# Patient Record
Sex: Male | Born: 1985 | Race: Black or African American | Hispanic: No | Marital: Single | Smoking: Never smoker
Health system: Southern US, Community
[De-identification: ages and names within clinical notes are randomized; demographics above are authoritative.]

---

## 2013-01-17 ENCOUNTER — Emergency Department (HOSPITAL_COMMUNITY)
Admission: EM | Admit: 2013-01-17 | Discharge: 2013-01-17 | Disposition: A | Discharge status: Home / Self Care | Payer: Self-pay | Attending: Emergency Medicine | Admitting: Emergency Medicine

## 2013-01-17 ENCOUNTER — Emergency Department (HOSPITAL_COMMUNITY): Payer: Self-pay

## 2013-01-17 ENCOUNTER — Emergency Department (INDEPENDENT_AMBULATORY_CARE_PROVIDER_SITE_OTHER)
Admission: EM | Admit: 2013-01-17 | Discharge: 2013-01-17 | Disposition: A | Discharge status: Home / Self Care | Payer: Self-pay | Source: Home / Self Care | Attending: Family Medicine | Admitting: Family Medicine

## 2013-01-17 ENCOUNTER — Encounter (HOSPITAL_COMMUNITY): Payer: Self-pay | Admitting: Emergency Medicine

## 2013-01-17 DIAGNOSIS — R1011 Right upper quadrant pain: Secondary | ICD-10-CM | POA: Insufficient documentation

## 2013-01-17 DIAGNOSIS — R109 Unspecified abdominal pain: Secondary | ICD-10-CM

## 2013-01-17 DIAGNOSIS — R1031 Right lower quadrant pain: Secondary | ICD-10-CM | POA: Insufficient documentation

## 2013-01-17 LAB — CBC WITH DIFFERENTIAL/PLATELET
Basophils Absolute: 0.1 10*3/uL (ref 0.0–0.1)
Basophils Relative: 1 % (ref 0–1)
Eosinophils Absolute: 0.4 10*3/uL (ref 0.0–0.7)
HCT: 46.9 % (ref 39.0–52.0)
Hemoglobin: 17 g/dL (ref 13.0–17.0)
MCH: 31.7 pg (ref 26.0–34.0)
MCHC: 36.2 g/dL — ABNORMAL HIGH (ref 30.0–36.0)
Neutro Abs: 3.4 10*3/uL (ref 1.7–7.7)
Neutrophils Relative %: 58 % (ref 43–77)
Platelets: 201 10*3/uL (ref 150–400)
WBC: 5.9 10*3/uL (ref 4.0–10.5)

## 2013-01-17 LAB — COMPREHENSIVE METABOLIC PANEL
ALT: 42 U/L (ref 0–53)
AST: 28 U/L (ref 0–37)
Albumin: 4.1 g/dL (ref 3.5–5.2)
Alkaline Phosphatase: 106 U/L (ref 39–117)
BUN: 12 mg/dL (ref 6–23)
GFR calc Af Amer: >90 mL/min (ref >90)
Potassium: 3.8 mEq/L (ref 3.5–5.1)
Sodium: 139 mEq/L (ref 135–145)
Total Protein: 8.1 g/dL (ref 6.0–8.3)

## 2013-01-17 LAB — LIPASE, BLOOD: Lipase: 17 U/L (ref 11–59)

## 2013-01-17 MED ORDER — IOHEXOL 300 MG/ML  SOLN
100.0000 mL | Freq: Once | INTRAMUSCULAR | Status: AC | PRN
  Administered 2013-01-17: 100 mL via INTRAVENOUS

## 2013-01-17 MED ORDER — IOHEXOL 300 MG/ML  SOLN
25.0000 mL | Freq: Once | INTRAMUSCULAR | Status: AC | PRN
  Administered 2013-01-17: 25 mL via ORAL

## 2013-01-17 MED ORDER — TRAMADOL HCL 50 MG PO TABS: 50.0000 mg | ORAL_TABLET | Freq: Four times a day (QID) | ORAL | Status: DC | PRN

## 2013-01-17 NOTE — ED Provider Notes (Signed)
Medical screening examination/treatment/procedure(s) were performed by resident physician or non-physician practitioner and as supervising physician I was immediately available for consultation/collaboration.   Sarah Baez DOUGLAS MD.   Sharion Grieves D Rayvin Abid, MD 01/17/13 2000 

## 2013-01-17 NOTE — ED Provider Notes (Signed)
CSN: [id number]     Arrival date & time 01/17/13  1626 History   First MD Initiated Contact with Patient 01/17/13 1745     Chief Complaint  Patient presents with  . Abdominal Pain   (Consider location/radiation/quality/duration/timing/severity/associated sxs/prior Treatment) Patient is a 27 y.o. male presenting with abdominal pain. The history is provided by the patient. No language interpreter was used.  Abdominal Pain This is a new problem. The current episode started yesterday. The problem occurs constantly. The problem has been gradually worsening. Associated symptoms include abdominal pain. Nothing aggravates the symptoms. Nothing relieves the symptoms. He has tried nothing for the symptoms. The treatment provided no relief.  Pt complains of reoccuring abdomen pain.   Pt has had on and off in the past.   Bad today  Pt points to right upper quadrant and right lower quadrant as area of pain  History reviewed. No pertinent past medical history. History reviewed. No pertinent past surgical history. History reviewed. No pertinent family history. History  Substance Use Topics  . Smoking status: Never Smoker   . Smokeless tobacco: Not on file  . Alcohol Use: No    Review of Systems  Gastrointestinal: Positive for vomiting and abdominal pain. Negative for nausea and diarrhea.  All other systems reviewed and are negative.    Allergies  Review of patient's allergies indicates no known allergies.  Home Medications  No current outpatient prescriptions on file. BP 142/84  Pulse 68  Temp(Src) 98.7 F (37.1 C) (Oral)  Resp 18  SpO2 99% Physical Exam  Nursing note and vitals reviewed. Constitutional: He is oriented to person, place, and time. He appears well-developed and well-nourished.  HENT:  Head: Normocephalic.  Eyes: EOM are normal.  Neck: Normal range of motion.  Pulmonary/Chest: Effort normal.  Abdominal: He exhibits no distension. There is tenderness.  Tender right  upper and right lower quadrant  Musculoskeletal: Normal range of motion.  Neurological: He is alert and oriented to person, place, and time.  Psychiatric: He has a normal mood and affect.    ED Course  Procedures (including critical care time) Labs Review Labs Reviewed - No data to display Imaging Review No results found.  EKG Interpretation     Ventricular Rate:    PR Interval:    QRS Duration:   QT Interval:    QTC Calculation:   R Axis:     Text Interpretation:              MDM  No diagnosis found.  Pt to ED for evaluation of acute abdominal pain    Elson Areas, [place] 01/17/13 1808

## 2013-01-17 NOTE — ED Notes (Signed)
Pt sent here from [facility] for RUQ and RLQ pain x 1 day.  Denies nausea, but c/o hard stool and black stool.

## 2013-01-17 NOTE — ED Notes (Signed)
Per interpretor - pt c/o RLQ abd and mid-epigastric pain that started last night at 2am, pain is worse in RLQ. Pt also sts after the pain started his big toe on right foot started to feel numb. sts he has been feeling constipated. Denies n/v/d. Denies allergies. Pt in nad, skin warm and dry, resp e/u.

## 2013-01-17 NOTE — ED Notes (Addendum)
C/o upper abdominal pain onset last night.  Has had this pain before off and on for 1 mos. No N, V D but c/o constipation.  Last BM last night.  Has had black stool last night but no blood.  Had fever last night.  C/o numbness of toes R foot.  Travelled from [place] 3 weeks.  Pt. is an Administrator, [employer].  C/o dry mouth and headache. States the R side of his body is cold and the L side is hot. Pain in L knee.  No known injury.

## 2013-01-17 NOTE — ED Notes (Addendum)
CT called and informed pt finished his contrast drink. POCT stool card at [facility].

## 2013-01-17 NOTE — ED Notes (Signed)
Pt sent to intermediate care-[facility] not home as noted in D/C pallet.

## 2013-01-17 NOTE — ED Provider Notes (Signed)
CSN: [id number]     Arrival date & time 01/17/13  1818 History   First MD Initiated Contact with Patient 01/17/13 1846     Chief Complaint  Patient presents with  . Abdominal Pain   (Consider location/radiation/quality/duration/timing/severity/associated sxs/prior Treatment) HPI Comments: Patient is a 27 year old male with no significant past medical or surgical history. He presents to the emergency department with complaints of right-sided abdominal pain that he states he has had for the past week. He denies any fevers or chills. He denies any nausea vomiting or diarrhea. He denies constipation. He was originally seen at urgent care and then sent here for further workup.  This patient is from the country of [place] and just came here 3 weeks ago. [place] is located in [place].  The patient denies having been around anyone who has been sick and denies having been to any [place] countries.  Patient is a 27 y.o. male presenting with abdominal pain. The history is provided by the patient.  Abdominal Pain Pain location:  RUQ and RLQ Pain quality: cramping   Pain radiates to:  Does not radiate Pain severity:  Moderate Onset quality:  Sudden Duration:  1 week Timing:  Constant Progression:  Worsening Chronicity:  New Relieved by:  Nothing Worsened by:  Nothing tried Ineffective treatments:  None tried   History reviewed. No pertinent past medical history. History reviewed. No pertinent past surgical history. No family history on file. History  Substance Use Topics  . Smoking status: Never Smoker   . Smokeless tobacco: Not on file  . Alcohol Use: No    Review of Systems  Gastrointestinal: Positive for abdominal pain.  All other systems reviewed and are negative.    Allergies  Review of patient's allergies indicates no known allergies.  Home Medications  No current outpatient prescriptions on file. BP 129/86  Pulse 66  Temp(Src) 97.7 F (36.5 C) (Oral)  Resp 16   SpO2 100% Physical Exam  Nursing note and vitals reviewed. Constitutional: He is oriented to person, place, and time. He appears well-developed and well-nourished. No distress.  HENT:  Head: Normocephalic and atraumatic.  Mouth/Throat: Oropharynx is clear and moist.  Neck: Normal range of motion. Neck supple.  Cardiovascular: Normal rate, regular rhythm and normal heart sounds.   No murmur heard. Pulmonary/Chest: Effort normal and breath sounds normal. No respiratory distress. He has no wheezes.  Abdominal: Soft. Bowel sounds are normal. He exhibits no distension. There is no tenderness.  Musculoskeletal: Normal range of motion. He exhibits no edema.  Lymphadenopathy:    He has no cervical adenopathy.  Neurological: He is alert and oriented to person, place, and time.  Skin: Skin is warm and dry. He is not diaphoretic.    ED Course  Procedures (including critical care time) Labs Review Labs Reviewed  CBC WITH DIFFERENTIAL - Abnormal; Notable for the following:    MCHC 36.2 (*)    Eosinophils Relative 6 (*)    All other components within normal limits  COMPREHENSIVE METABOLIC PANEL  LIPASE, BLOOD   Imaging Review No results found.  EKG Interpretation   None       MDM  No diagnosis found. Patient is a 27 year old male from [place]. He speaks Arabic and history was taken with the help of the translator line. He presents with pain in the right side of his abdomen that has been ongoing for the past several days. He has no fever and workup reveals no elevation of white count.  A CT scan of the abdomen and pelvis was obtained but did not reveal any acute intra-abdominal pathology. He will be discharged to home with NSAIDs and pain medication, to return as needed if his symptoms worsen or change.    Geoffery Lyons, MD 01/18/13 0010

## 2013-01-17 NOTE — ED Notes (Signed)
Pt updated on plan of care with use of interpretor phones.

## 2015-06-24 IMAGING — CT CT ABD-PELV W/ CM
2 of 4 series · 17 of 46 positions shown, 19 images · IV contrast (CONTRAST)
Comparison: None.

CLINICAL DATA: Right lower quadrant and mid epigastric abdominal
pain.

EXAM:
CT ABDOMEN AND PELVIS WITH CONTRAST
TECHNIQUE: Multidetector CT imaging of the abdomen and pelvis was performed
using the standard protocol following bolus administration of
intravenous contrast.
CONTRAST:  100mL OMNIPAQUE IOHEXOL 300 MG/ML  SOLN

[Series 2: routine · axial · 0.68mm/px · z∈[+26,+441]mm · 14 of 91 slices shown, 16 images]
[im 4/91  soft-tissue]
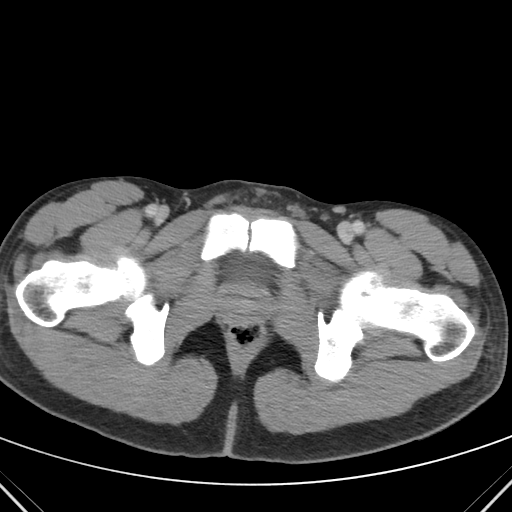
[im 4/91  bone]
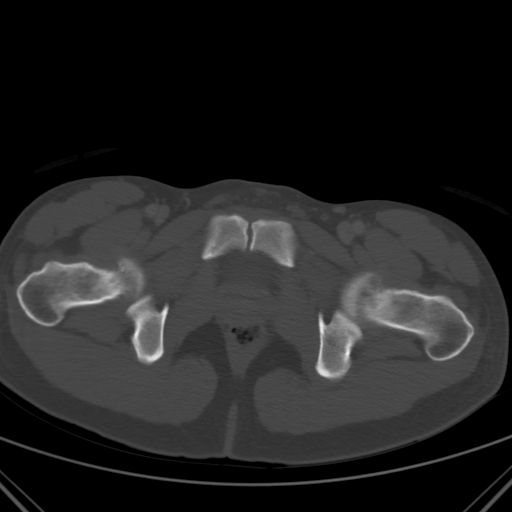
[im 12/91  soft-tissue]
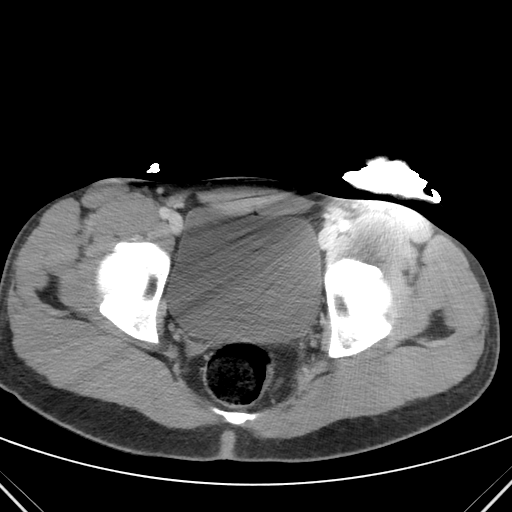
[im 16/91  soft-tissue]
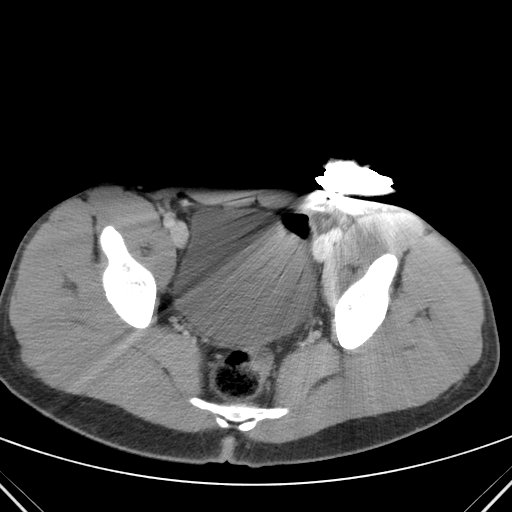
[im 24/91  soft-tissue]
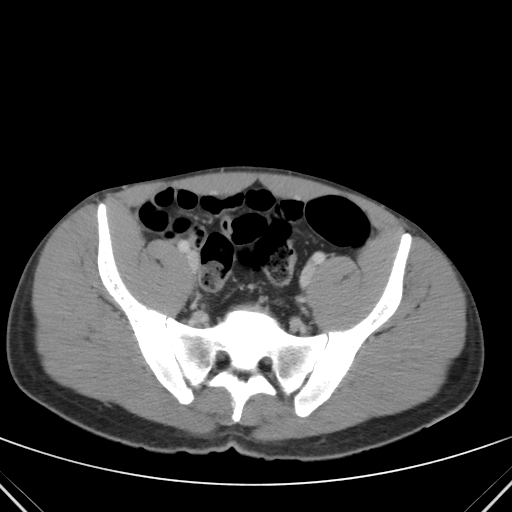
[im 32/91  soft-tissue]
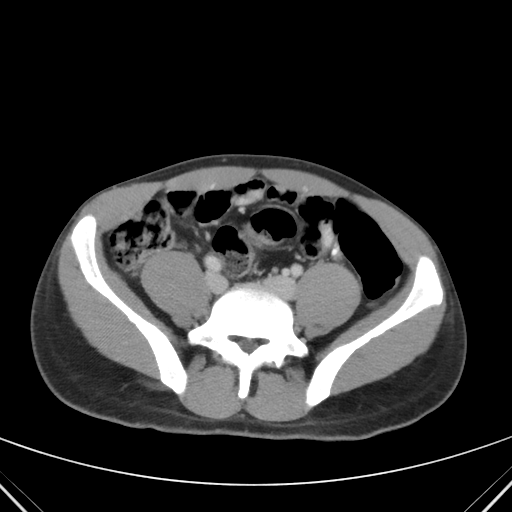
[im 36/91  soft-tissue]
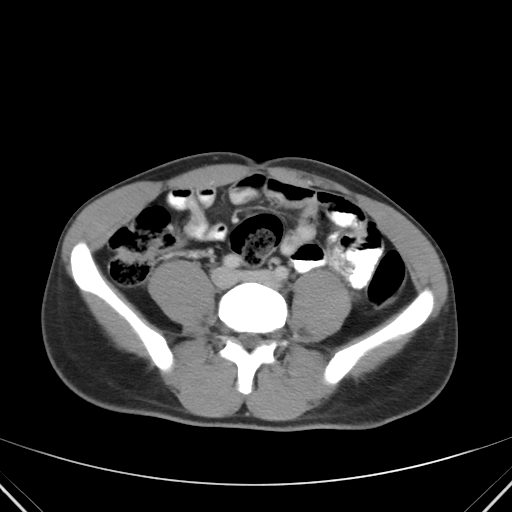
[im 44/91  soft-tissue]
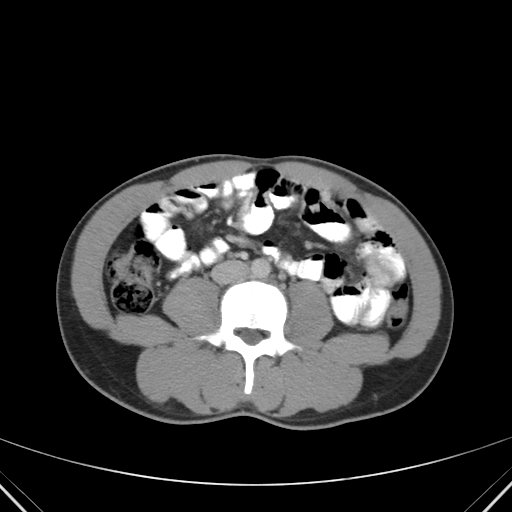
[im 47/91  soft-tissue]
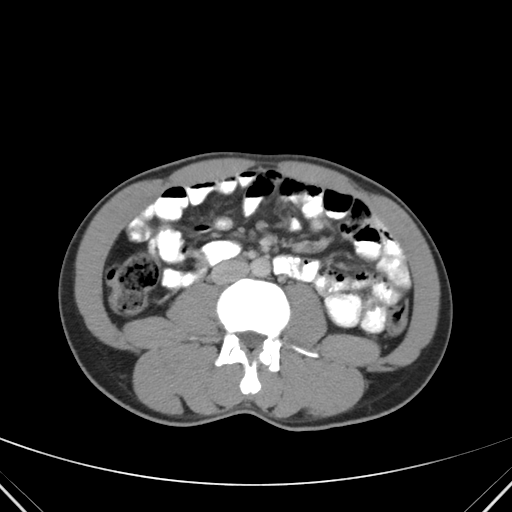
[im 55/91  soft-tissue]
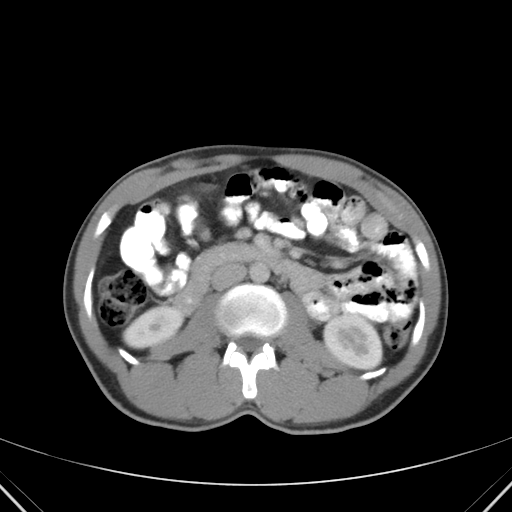
[im 55/91  bone]
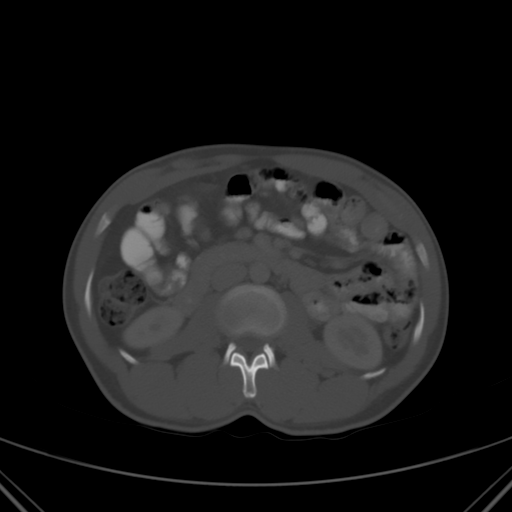
[im 59/91  soft-tissue]
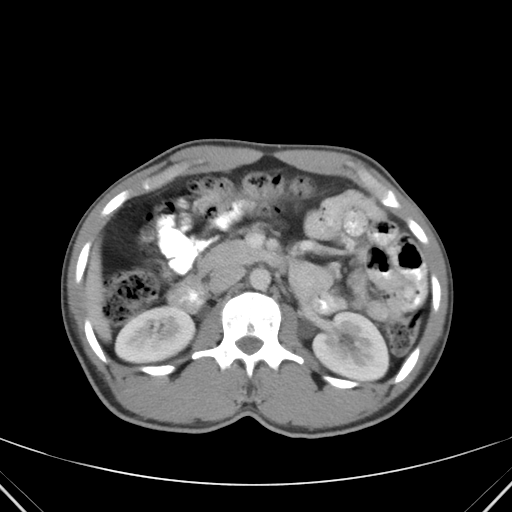
[im 67/91  soft-tissue]
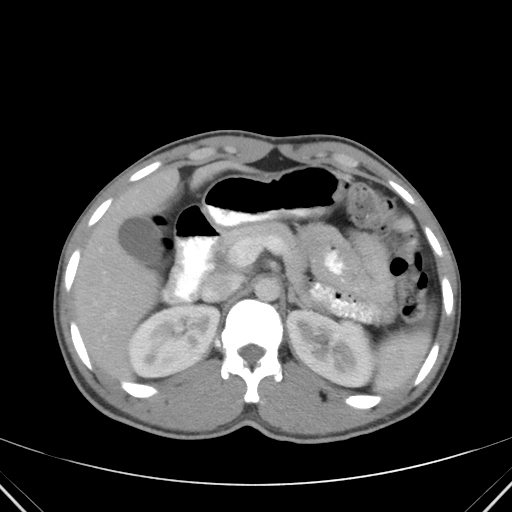
[im 75/91  soft-tissue]
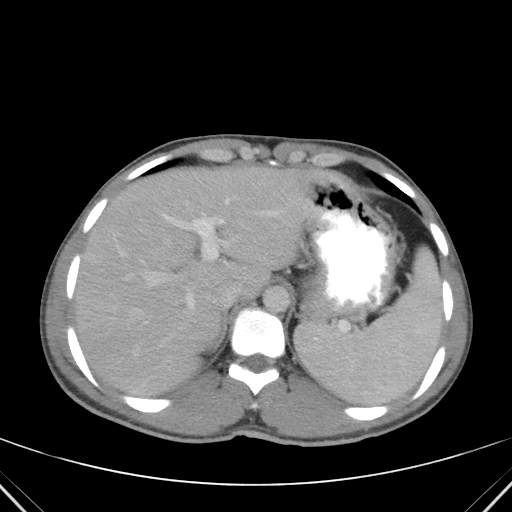
[im 79/91  soft-tissue]
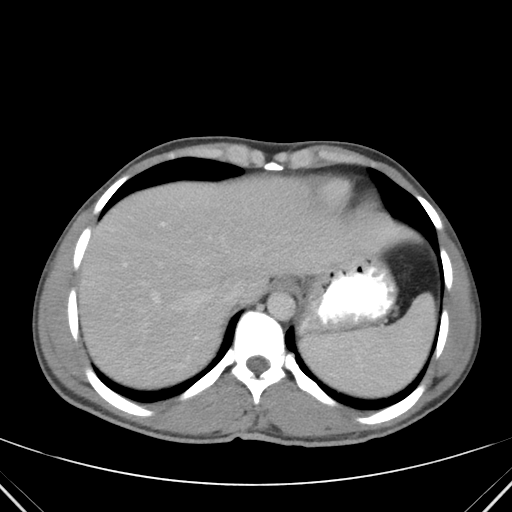
[im 87/91  soft-tissue]
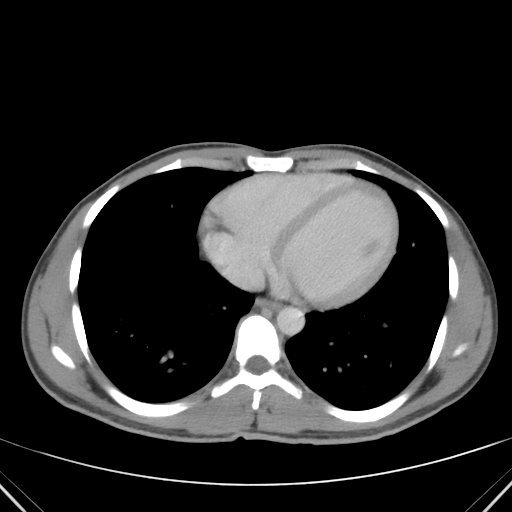

[mpr, coronals, coronal · coronal · 0.88mm/px · 3 of 73 slices shown]
[im 25/73  soft-tissue]
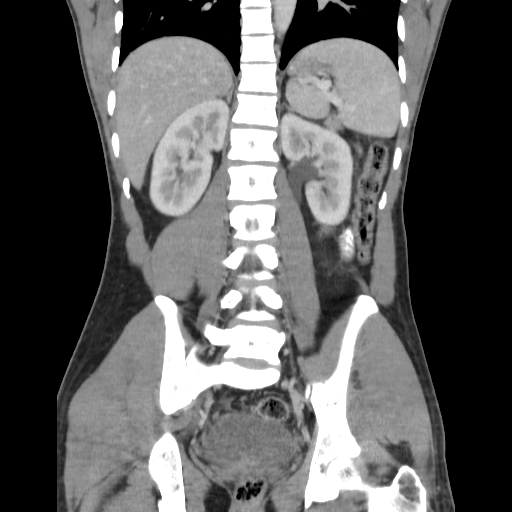
[im 33/73  soft-tissue]
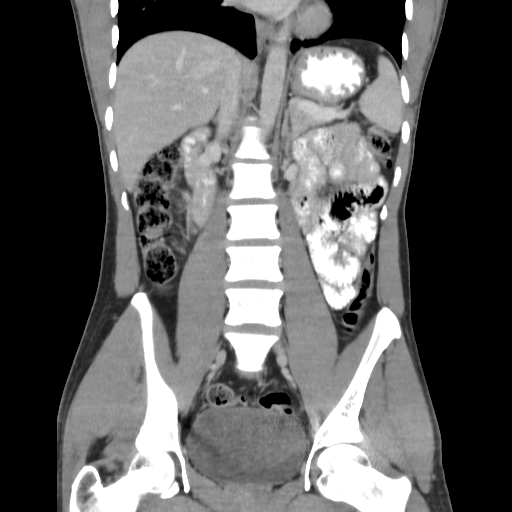
[im 41/73  soft-tissue]
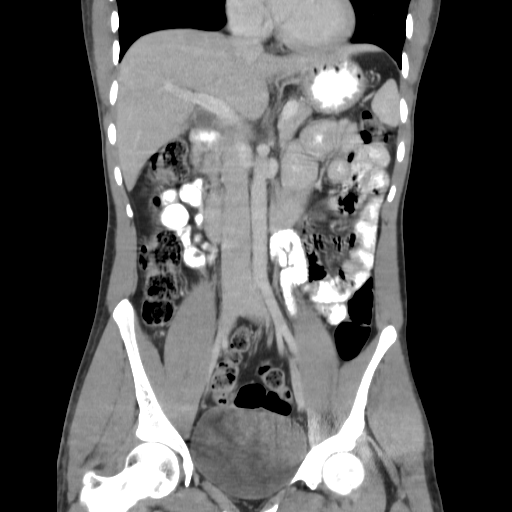

[17 of 46 positions shown; findings below may reference images not displayed]

FINDINGS: 4 mm rounded area low density in the superior aspect of the liver on
the right. Unremarkable spleen, pancreas, gallbladder, adrenal
glands, kidneys and urinary bladder. Streak artifact from an
overlying buckle anteriorly on the left at the level of the pelvis.
Normal-sized prostate gland. No gastrointestinal abnormalities or
enlarged lymph nodes. Normal appearing appendix without evidence of
appendicitis. Clear lung bases. Small bulla in the left lower lobe.
Normal appearing bones.
IMPRESSION: No acute abnormality.  4 mm probable cyst in the liver.

## 2018-06-30 ENCOUNTER — Other Ambulatory Visit: Payer: Self-pay

## 2018-06-30 ENCOUNTER — Encounter (HOSPITAL_COMMUNITY): Payer: Self-pay

## 2018-06-30 ENCOUNTER — Ambulatory Visit (HOSPITAL_COMMUNITY)
Admission: EM | Admit: 2018-06-30 | Discharge: 2018-06-30 | Disposition: A | Discharge status: Home / Self Care | Payer: BLUE CROSS/BLUE SHIELD | Attending: Family Medicine | Admitting: Family Medicine

## 2018-06-30 DIAGNOSIS — R0981 Nasal congestion: Secondary | ICD-10-CM

## 2018-06-30 DIAGNOSIS — R112 Nausea with vomiting, unspecified: Secondary | ICD-10-CM

## 2018-06-30 DIAGNOSIS — A084 Viral intestinal infection, unspecified: Secondary | ICD-10-CM

## 2018-06-30 MED ORDER — ONDANSETRON 4 MG PO TBDP
ORAL_TABLET | ORAL | Status: AC
  Filled 2018-06-30: qty 1

## 2018-06-30 MED ORDER — ONDANSETRON HCL 4 MG PO TABS: 4.0000 mg | ORAL_TABLET | Freq: Four times a day (QID) | ORAL | 0 refills | Status: AC

## 2018-06-30 MED ORDER — CETIRIZINE-PSEUDOEPHEDRINE ER 5-120 MG PO TB12: 1.0000 | ORAL_TABLET | Freq: Every day | ORAL | 0 refills | Status: AC

## 2018-06-30 MED ORDER — ONDANSETRON 4 MG PO TBDP
4.0000 mg | ORAL_TABLET | Freq: Once | ORAL | Status: AC
  Administered 2018-06-30: 17:00:00 4 mg via ORAL

## 2018-06-30 MED ORDER — OXYMETAZOLINE HCL 0.05 % NA SOLN: 1.0000 | Freq: Two times a day (BID) | NASAL | 0 refills | Status: AC

## 2018-06-30 NOTE — ED Triage Notes (Signed)
Began vomiting after eating this morning, has vomited 2 times also states he cant smell

## 2018-06-30 NOTE — ED Provider Notes (Signed)
[facility]   [id number] 06/30/18 Arrival Time: 1553  CC: Vomiting and nasal congestion  SUBJECTIVE:  Dyllin Selmer Dominiondriss is a 33 y.o. male who presents with complaint of 2 episodes of vomiting that occurred this morning.  Denies a precipitating event, trauma, close contacts with similar symptoms, recent travel or antibiotic use.  Denies abdominal or flank pain.  Has not tried OTC medications.  Symptoms made worse with eating.  Last BM this morning and normal for patient.    Denies fever, chills, chest pain, SOB, diarrhea, constipation, hematochezia, melena, dysuria, difficulty urinating, increased frequency or urgency, flank pain, loss of bowel or bladder function.  Also reports nasal congestion x 1 week.  Denies known sick exposure.  Has been using flonase without relief.  Denies sore throat, cough, rhinorrhea.    No LMP for male patient.  ROS: As per HPI.  History reviewed. No pertinent past medical history. History reviewed. No pertinent surgical history. No Known Allergies No current facility-administered medications on file prior to encounter.    No current outpatient medications on file prior to encounter.   Social History   Socioeconomic History   Marital status: Single    Spouse name: Not on file   Number of children: Not on file   Years of education: Not on file   Highest education level: Not on file  Occupational History   Not on file  Social Needs   Financial resource strain: Not on file   Food insecurity:    Worry: Not on file    Inability: Not on file   Transportation needs:    Medical: Not on file    Non-medical: Not on file  Tobacco Use   Smoking status: Never Smoker  Substance and Sexual Activity   Alcohol use: No   Drug use: No   Sexual activity: Not on file  Lifestyle   Physical activity:    Days per week: Not on file    Minutes per session: Not on file   Stress: Not on file  Relationships   Social connections:    Talks  on phone: Not on file    Gets together: Not on file    Attends religious service: Not on file    Active member of club or organization: Not on file    Attends meetings of clubs or organizations: Not on file    Relationship status: Not on file   Intimate partner violence:    Fear of current or ex partner: Not on file    Emotionally abused: Not on file    Physically abused: Not on file    Forced sexual activity: Not on file  Other Topics Concern   Not on file  Social History Narrative   Not on file   History reviewed. No pertinent family history.   OBJECTIVE:  Vitals:   06/30/18 1625  BP: 128/82  Pulse: 73  Resp: 18  Temp: 98.6 F (37 C)  SpO2: 100%    General appearance: Alert; appears mildly fatigued, but nontoxic HEENT: NCAT. PERRL. EOMI grossly; Nose: Nares patent, with swollen turbinates and mild rhinorrhea; Throat: Oropharynx clear.  Lungs: clear to auscultation bilaterally without adventitious breath sounds Heart: regular rate and rhythm.  Radial pulses 2+ symmetrical bilaterally Abdomen: soft, non-distended; normal active bowel sounds; non-tender to light and deep palpation; nontender at McBurney's point; no guarding Extremities: no edema; symmetrical with no gross deformities Skin: warm and dry Neurologic: normal gait Psychological: alert and cooperative; normal mood and affect  ASSESSMENT & PLAN:  1. Viral gastroenteritis   2. Non-intractable vomiting with nausea, unspecified vomiting type   3. Nasal congestion     Meds ordered this encounter  Medications   ondansetron (ZOFRAN-ODT) disintegrating tablet 4 mg   ondansetron (ZOFRAN) 4 MG tablet    Sig: Take 1 tablet (4 mg total) by mouth every 6 (six) hours.    Dispense:  12 tablet    Refill:  0    Order Specific Question:   Supervising Provider    Answer:   Eustace Moore [[id number]]   oxymetazoline (AFRIN NASAL SPRAY) 0.05 % nasal spray    Sig: Place 1 spray into both nostrils 2 (two) times  daily.    Dispense:  30 mL    Refill:  0    Order Specific Question:   Supervising Provider    Answer:   Eustace Moore [[id number]]   cetirizine-pseudoephedrine (ZYRTEC-D) 5-120 MG tablet    Sig: Take 1 tablet by mouth daily.    Dispense:  30 tablet    Refill:  0    Order Specific Question:   Supervising Provider    Answer:   Eustace Moore [[id number]]   Vomiting: Zofran given in office Get rest and drink fluids Zofran prescribed.  Take as directed.    DIET Instructions:  30 minutes after taking nausea medicine, begin with sips of clear liquids. If able to hold down 2 - 4 ounces for 30 minutes, begin drinking more. Increase your fluid intake to replace losses. Clear liquids only for 24 hours (water, tea, sport drinks, clear flat ginger ale or cola and juices, broth, jello, popsicles, ect). Advance to bland foods, applesauce, rice, baked or boiled chicken, ect. Avoid milk, greasy foods and anything that doesnt agree with you.  If you experience new or worsening symptoms return or go to ER such as fever, chills, nausea, vomiting, diarrhea, bloody or dark tarry stools, constipation, urinary symptoms, worsening abdominal discomfort, symptoms that do not improve with medications, inability to keep fluids down, etc...  Nasal congestion: Afrin nasal spray prescribed.  Use for 3 days for congestion.  DO NOT USE longer than 3 days or you may have rebound congestion Zyrtec prescribed.  Take daily for symptomatic relief Follow up with PCP or [facility] if symptoms persists Return or go to the ED if you have any new or worsening symptoms such as fever, chills, sore throat, cough, chest pain, chest pressure, etc...  Reviewed expectations re: course of current medical issues. Questions answered. Outlined signs and symptoms indicating need for more acute intervention. Patient verbalized understanding. After Visit Summary given.   Rennis Harding, PA-C 06/30/18 1827

## 2018-06-30 NOTE — Discharge Instructions (Signed)
Vomiting: Zofran given in office Get rest and drink fluids Zofran prescribed.  Take as directed.    DIET Instructions:  30 minutes after taking nausea medicine, begin with sips of clear liquids. If able to hold down 2 - 4 ounces for 30 minutes, begin drinking more. Increase your fluid intake to replace losses. Clear liquids only for 24 hours (water, tea, sport drinks, clear flat ginger ale or cola and juices, broth, jello, popsicles, ect). Advance to bland foods, applesauce, rice, baked or boiled chicken, ect. Avoid milk, greasy foods and anything that doesnt agree with you.  If you experience new or worsening symptoms return or go to ER such as fever, chills, nausea, vomiting, diarrhea, bloody or dark tarry stools, constipation, urinary symptoms, worsening abdominal discomfort, symptoms that do not improve with medications, inability to keep fluids down, etc...  Nasal congestion: Afrin nasal spray prescribed.  Use for 3 days for congestion.  DO NOT USE longer than 3 days or you may have rebound congestion Zyrtec prescribed.  Take daily for symptomatic relief Follow up with PCP or [facility] if symptoms persists Return or go to the ED if you have any new or worsening symptoms such as fever, chills, sore throat, cough, chest pain, chest pressure, etc..Marland Kitchen
